# Patient Record
Sex: Male | Born: 1970 | Race: White | Hispanic: No | Marital: Married | Smoking: Never smoker
Health system: Southern US, Community
[De-identification: ages and names within clinical notes are randomized; demographics above are authoritative.]

## PROBLEM LIST (undated history)

## (undated) DIAGNOSIS — I471 Supraventricular tachycardia, unspecified: Secondary | ICD-10-CM

## (undated) DIAGNOSIS — M1990 Unspecified osteoarthritis, unspecified site: Secondary | ICD-10-CM

## (undated) HISTORY — DX: Supraventricular tachycardia: I47.1

## (undated) HISTORY — DX: Supraventricular tachycardia, unspecified: I47.10

---

## 2012-02-27 ENCOUNTER — Other Ambulatory Visit: Payer: Self-pay | Admitting: Specialist

## 2012-02-27 ENCOUNTER — Ambulatory Visit
Admission: RE | Admit: 2012-02-27 | Discharge: 2012-02-27 | Disposition: A | Discharge status: Home / Self Care | Payer: BC Managed Care – PPO | Source: Ambulatory Visit | Attending: Family Medicine | Admitting: Family Medicine

## 2012-02-27 ENCOUNTER — Other Ambulatory Visit: Payer: Self-pay | Admitting: Family Medicine

## 2012-02-27 DIAGNOSIS — M549 Dorsalgia, unspecified: Secondary | ICD-10-CM

## 2012-02-27 MED ORDER — IOHEXOL 180 MG/ML  SOLN
1.0000 mL | Freq: Once | INTRAMUSCULAR | Status: AC | PRN
  Administered 2012-02-27: 1 mL via EPIDURAL

## 2012-02-27 MED ORDER — METHYLPREDNISOLONE ACETATE 40 MG/ML INJ SUSP (RADIOLOG
120.0000 mg | Freq: Once | INTRAMUSCULAR | Status: AC
  Administered 2012-02-27: 120 mg via EPIDURAL

## 2012-02-27 NOTE — Discharge Instructions (Signed)

## 2016-11-20 DIAGNOSIS — R1013 Epigastric pain: Secondary | ICD-10-CM | POA: Insufficient documentation

## 2016-11-20 DIAGNOSIS — Z6830 Body mass index (BMI) 30.0-30.9, adult: Secondary | ICD-10-CM | POA: Insufficient documentation

## 2016-11-20 DIAGNOSIS — K828 Other specified diseases of gallbladder: Secondary | ICD-10-CM | POA: Insufficient documentation

## 2016-12-31 DIAGNOSIS — Z09 Encounter for follow-up examination after completed treatment for conditions other than malignant neoplasm: Secondary | ICD-10-CM | POA: Insufficient documentation

## 2017-10-25 ENCOUNTER — Encounter (HOSPITAL_BASED_OUTPATIENT_CLINIC_OR_DEPARTMENT_OTHER): Payer: Self-pay | Admitting: *Deleted

## 2017-10-25 ENCOUNTER — Emergency Department (HOSPITAL_BASED_OUTPATIENT_CLINIC_OR_DEPARTMENT_OTHER): Payer: BLUE CROSS/BLUE SHIELD

## 2017-10-25 ENCOUNTER — Other Ambulatory Visit: Payer: Self-pay

## 2017-10-25 ENCOUNTER — Emergency Department (HOSPITAL_BASED_OUTPATIENT_CLINIC_OR_DEPARTMENT_OTHER)
Admission: EM | Admit: 2017-10-25 | Discharge: 2017-10-25 | Disposition: A | Discharge status: Home / Self Care | Payer: BLUE CROSS/BLUE SHIELD | Attending: Emergency Medicine | Admitting: Emergency Medicine

## 2017-10-25 DIAGNOSIS — R079 Chest pain, unspecified: Secondary | ICD-10-CM | POA: Insufficient documentation

## 2017-10-25 DIAGNOSIS — R002 Palpitations: Secondary | ICD-10-CM | POA: Diagnosis not present

## 2017-10-25 HISTORY — DX: Unspecified osteoarthritis, unspecified site: M19.90

## 2017-10-25 LAB — BASIC METABOLIC PANEL
Anion gap: 10 (ref 5–15)
BUN: 15 mg/dL (ref 6–20)
CO2: 25 mmol/L (ref 22–32)
Calcium: 9.4 mg/dL (ref 8.9–10.3)
Chloride: 106 mmol/L (ref 101–111)
Creatinine, Ser: 0.96 mg/dL (ref 0.61–1.24)
GFR calc Af Amer: >60 mL/min (ref >60)
GFR calc non Af Amer: >60 mL/min (ref >60)
Glucose, Bld: 105 mg/dL — ABNORMAL HIGH (ref 65–99)
Potassium: 4 mmol/L (ref 3.5–5.1)
Sodium: 141 mmol/L (ref 135–145)

## 2017-10-25 LAB — CBC WITH DIFFERENTIAL/PLATELET
Basophils Absolute: 0 10*3/uL (ref 0.0–0.1)
Basophils Relative: 0 %
Eosinophils Absolute: 0.1 10*3/uL (ref 0.0–0.7)
Eosinophils Relative: 1 %
HCT: 44.8 % (ref 39.0–52.0)
Hemoglobin: 15.7 g/dL (ref 13.0–17.0)
Lymphocytes Relative: 23 %
Lymphs Abs: 1.6 10*3/uL (ref 0.7–4.0)
MCH: 31 pg (ref 26.0–34.0)
MCHC: 35 g/dL (ref 30.0–36.0)
MCV: 88.5 fL (ref 78.0–100.0)
Monocytes Absolute: 0.7 10*3/uL (ref 0.1–1.0)
Monocytes Relative: 10 %
Neutro Abs: 4.6 10*3/uL (ref 1.7–7.7)
Neutrophils Relative %: 66 %
Platelets: 191 10*3/uL (ref 150–400)
RBC: 5.06 MIL/uL (ref 4.22–5.81)
RDW: 12.6 % (ref 11.5–15.5)
WBC: 7 10*3/uL (ref 4.0–10.5)

## 2017-10-25 LAB — TROPONIN I
Troponin I: <0.03 ng/mL (ref <0.03)
Troponin I: <0.03 ng/mL (ref <0.03)

## 2017-10-25 NOTE — ED Triage Notes (Signed)
Pt reports 2 days of "tightness" to his center chest, worse today since stretching his arms this morning. Denies any sob, dizzyness, diaphoresis, nausea or any other c/o.

## 2017-10-25 NOTE — Discharge Instructions (Signed)
Please follow-up with a cardiologist for further evaluation and treatment of your symptoms.  Specify during a phone call which location would be more convenient for you.  Take your famotidine daily as prescribed to rule out your reflux a cause of your pain.  Please return to the emergency department immediately if you develop any new or worsening symptoms.

## 2017-10-25 NOTE — ED Provider Notes (Signed)
[facility] EMERGENCY DEPARTMENT Provider Note   CSN: [id number] Arrival date & time: 10/25/17  1012     History   Chief Complaint Chief Complaint  Patient presents with  . Chest Pain    HPI Thomas Perez is a 47 y.o. male with history of GERD who presents with a 1 day history of central chest pain.  Patient reports chest tightness with intermittent sharp pains.  Patient has had associated intermittent palpitations on the left side of his chest.  At one time, it happened when he rolled over in bed.  Resolves in about 30 seconds.  Patient reports he has had intermittent palpitations for the past 2-3 weeks, but the chest pain started last evening when he was getting ready for bed.  It got worse this morning when he stretched his arms when he woke up.  He reports lying in bed last night he had a throbbing sensation in his chest.  He denies any severe pain now, however he reports when he takes a very deep breath he feels mild tightness.  He denies paresthesias of his hands or fingers.  He denies any recent long trips, surgeries, known cancer, history of blood clots, new leg pain or swelling.  He denies any radiation of pain, fever, shortness of breath, abdominal pain, nausea, vomiting.  HPI  Past Medical History:  Diagnosis Date  . Arthritis     There are no active problems to display for this patient.   History reviewed. No pertinent surgical history.     Home Medications    Prior to Admission medications   Not on File    Family History History reviewed. No pertinent family history.  Social History Social History   Tobacco Use  . Smoking status: Never Smoker  . Smokeless tobacco: Never Used  Substance Use Topics  . Alcohol use: Not on file  . Drug use: Not on file     Allergies   Patient has no known allergies.   Review of Systems Review of Systems  Constitutional: Negative for chills and fever.  HENT: Negative for facial swelling and sore throat.     Respiratory: Negative for shortness of breath.   Cardiovascular: Positive for chest pain and palpitations. Negative for leg swelling.  Gastrointestinal: Negative for abdominal pain, nausea and vomiting.  Genitourinary: Negative for dysuria.  Musculoskeletal: Negative for back pain.  Skin: Negative for rash and wound.  Neurological: Negative for headaches.  Psychiatric/Behavioral: The patient is not nervous/anxious.      Physical Exam Updated Vital Signs BP 113/67   Pulse (!) 56   Temp 98.4 F (36.9 C) (Oral)   Resp 12   Ht 5\' 10"  (1.778 m)   Wt 96.6 kg (213 lb)   SpO2 100%   BMI 30.56 kg/m   Physical Exam  Constitutional: He appears well-developed and well-nourished. No distress.  HENT:  Head: Normocephalic and atraumatic.  Mouth/Throat: Oropharynx is clear and moist. No oropharyngeal exudate.  Eyes: Conjunctivae are normal. Pupils are equal, round, and reactive to light. Right eye exhibits no discharge. Left eye exhibits no discharge. No scleral icterus.  Neck: Normal range of motion. Neck supple. No thyromegaly present.  Cardiovascular: Regular rhythm, normal heart sounds and intact distal pulses. Exam reveals no gallop and no friction rub.  No murmur heard. Pulmonary/Chest: Effort normal and breath sounds normal. No stridor. No respiratory distress. He has no wheezes. He has no rales. He exhibits no tenderness.  Abdominal: Soft. Bowel sounds are normal. He  exhibits no distension. There is no tenderness. There is no rebound and no guarding.  Musculoskeletal: He exhibits no edema.  No calf edema or tenderness bilaterally  Lymphadenopathy:    He has no cervical adenopathy.  Neurological: He is alert. Coordination normal.  Skin: Skin is warm and dry. No rash noted. He is not diaphoretic. No pallor.  Psychiatric: He has a normal mood and affect.  Nursing note and vitals reviewed.    ED Treatments / Results  Labs (all labs ordered are listed, but only abnormal results  are displayed) Labs Reviewed  BASIC METABOLIC PANEL - Abnormal; Notable for the following components:      Result Value   Glucose, Bld 105 (*)    All other components within normal limits  CBC WITH DIFFERENTIAL/PLATELET  TROPONIN I  TROPONIN I    EKG  EKG Interpretation None       Radiology Dg Chest 2 View  Result Date: 10/25/2017 CLINICAL DATA:  Chest pain and palpitations EXAM: CHEST  2 VIEW COMPARISON:  None. FINDINGS: The heart size and mediastinal contours are within normal limits. Both lungs are clear. The visualized skeletal structures are unremarkable. IMPRESSION: No active cardiopulmonary disease. Electronically Signed   By: Franchot Gallo M.D.   On: 10/25/2017 12:59    Procedures Procedures (including critical care time)  Medications Ordered in ED Medications - No data to display   Initial Impression / Assessment and Plan / ED Course  I have reviewed the triage vital signs and the nursing notes.  Pertinent labs & imaging results that were available during my care of the patient were reviewed by me and considered in my medical decision making (see chart for details).     Patient with intermittent palpitations over the past 2-3 weeks.  He developed chest tightness last evening.  Wells and PERC negative.  Delta troponin is negative.  Low suspicion for ACS.  Palpitations may be causing anxiety type chest tightness, as patient does state he is stressed about the palpitations.  Although, no history of anxiety or panic attacks.  Will refer to cardiology for further workup, which may include Holter monitor.  I also advised to take patient's famotidine regularly to rule out GERD as a cause of patient's chest pain.  Return precautions discussed.  Patient understands and agrees with plan.  Patient vitals stable throughout ED course and discharged in satisfactory condition. I discussed patient case with Dr. Lita Mains who guided the patient's management and agrees with  plan.   Final Clinical Impressions(s) / ED Diagnoses   Final diagnoses:  Nonspecific chest pain  Palpitations    ED Discharge Orders    None       Frederica Kuster, PA-C 10/25/17 1649    Julianne Rice, MD 10/29/17 2233

## 2017-10-25 NOTE — ED Notes (Signed)
Patient transported to X-ray 

## 2017-10-30 ENCOUNTER — Ambulatory Visit (INDEPENDENT_AMBULATORY_CARE_PROVIDER_SITE_OTHER): Payer: BLUE CROSS/BLUE SHIELD | Admitting: Cardiovascular Disease

## 2017-10-30 ENCOUNTER — Ambulatory Visit (INDEPENDENT_AMBULATORY_CARE_PROVIDER_SITE_OTHER): Payer: BLUE CROSS/BLUE SHIELD

## 2017-10-30 ENCOUNTER — Encounter: Payer: Self-pay | Admitting: Cardiovascular Disease

## 2017-10-30 VITALS — BP 122/78 | HR 82 | Ht 70.0 in | Wt 219.0 lb

## 2017-10-30 DIAGNOSIS — I471 Supraventricular tachycardia: Secondary | ICD-10-CM | POA: Diagnosis not present

## 2017-10-30 DIAGNOSIS — Z13220 Encounter for screening for lipoid disorders: Secondary | ICD-10-CM

## 2017-10-30 NOTE — Progress Notes (Signed)
Cardiology Office Note:    Date:  10/30/2017   ID:  Thomas Perez, DOB February 19, 1971, MRN [id number]  PCP:  Enid Skeens., MD  Cardiologist:  No primary care provider on file.   Referring MD: Enid Skeens., MD   Chief Complaint  Patient presents with  . Palpitations    History of Present Illness:    Thomas Perez is a 47 y.o. male with a hx of gastroesophageal reflux disease who we are seen today for further evaluation of palpitations and chest tightness.  The patient is a relatively healthy 47 year old gentleman who was seen in the emergency room on February 8 for palpitations and chest tightness.  Last fall he had some palpitations - lasted less than 1 min.  Several days ago, he had a 2 minute episode .   Followed by a dull heaviness, pressure sensation  Not associated with exertion or eating , change of position Not associated with deep breath .  Has had ~ 10 episodes over the past several weeks.  These do not cause any syncope or presyncope  Thinks the HR is regular, but very fast  Occurs most days - not every day   Exercises on occasion.   Walks at work [employer], walks the plant floor on occasion )      Past Medical History:  Diagnosis Date  . Arthritis     History reviewed. No pertinent surgical history.  Current Medications: Current Meds  Medication Sig  . famotidine (PEPCID) 20 MG tablet Take 20 mg by mouth 2 (two) times daily.     Allergies:   Hydrocodone-acetaminophen   Social History   Socioeconomic History  . Marital status: Married    Spouse name: None  . Number of children: None  . Years of education: None  . Highest education level: None  Social Needs  . Financial resource strain: None  . Food insecurity - worry: None  . Food insecurity - inability: None  . Transportation needs - medical: None  . Transportation needs - non-medical: None  Occupational History  . None  Tobacco Use  . Smoking status: Never Smoker  . Smokeless  tobacco: Never Used  Substance and Sexual Activity  . Alcohol use: None  . Drug use: None  . Sexual activity: None  Other Topics Concern  . None  Social History Narrative  . None     Family History: The patient's family history includes Arrhythmia in his father and mother; Hypertension in his father.  ROS:   Please see the history of present illness.     All other systems reviewed and are negative.  EKGs/Labs/Other Studies Reviewed:    The following studies were reviewed today:   EKG:  EKG - Feb. 8, 2019 .  The ekg ordered today demonstrates  NSR at 81, no ST or T wave changes   Recent Labs: 10/25/2017: BUN 15; Creatinine, Ser 0.96; Hemoglobin 15.7; Platelets 191; Potassium 4.0; Sodium 141  Recent Lipid Panel No results found for: CHOL, TRIG, HDL, CHOLHDL, VLDL, LDLCALC, LDLDIRECT  Physical Exam:    VS:  BP 122/78   Pulse 82   Ht 5\' 10"  (1.778 m)   Wt 219 lb (99.3 kg)   SpO2 99%   BMI 31.42 kg/m     Wt Readings from Last 3 Encounters:  10/30/17 219 lb (99.3 kg)  10/25/17 213 lb (96.6 kg)     GEN:  Well nourished, well developed in no acute distress HEENT: Normal NECK: No  JVD; No carotid bruits LYMPHATICS: No lymphadenopathy CARDIAC:RR, no murmurs, rubs, gallops RESPIRATORY:  Clear to auscultation without rales, wheezing or rhonchi  ABDOMEN: Soft, non-tender, non-distended MUSCULOSKELETAL:  No edema; No deformity  SKIN: Warm and dry NEUROLOGIC:  Alert and oriented x 3 PSYCHIATRIC:  Normal affect   ASSESSMENT:    No diagnosis found. PLAN:    In order of problems listed above:  1. Palpitations:   Sounds like SVT.  Discussed Valsalva, diving reflex, carotid sinus massage Discussed the relative benign nature of these  Avoid caffiene, Check TSH Encouraged regular exercise   2. Screening for cholesterol elevation ( father had elevated cholesterol )    Medication Adjustments/Labs and Tests Ordered: Current medicines are reviewed at length with the  patient today.  Concerns regarding medicines are outlined above.  No orders of the defined types were placed in this encounter.  No orders of the defined types were placed in this encounter.   Signed, Mertie Moores, MD  10/30/2017 8:15 AM    [place]

## 2017-10-30 NOTE — Patient Instructions (Addendum)
Medication Instructions:  Your physician recommends that you continue on your current medications as directed. Please refer to the Current Medication list given to you today.   Labwork: TODAY - cholesterol, TSH   Testing/Procedures: Your physician has recommended that you wear an event monitor. Event monitors are medical devices that record the heart's electrical activity. Doctors most often [place] these monitors to diagnose arrhythmias. Arrhythmias are problems with the speed or rhythm of the heartbeat. The monitor is a small, portable device. You can wear one while you do your normal daily activities. This is usually used to diagnose what is causing palpitations/syncope (passing out).   Follow-Up: Your physician recommends that you return for a follow-up appointment on Friday March 15 at 7:40 am   If you need a refill on your cardiac medications before your next appointment, please call your pharmacy.   Thank you for choosing [facility]! Christen Bame, RN [phone]

## 2017-10-31 LAB — LIPID PANEL
CHOL/HDL RATIO: 5 ratio (ref 0.0–5.0)
Cholesterol, Total: 165 mg/dL (ref 100–199)
HDL: 33 mg/dL — AB (ref >39)
LDL Calculated: 102 mg/dL — ABNORMAL HIGH (ref 0–99)
TRIGLYCERIDES: 151 mg/dL — AB (ref 0–149)
VLDL Cholesterol Cal: 30 mg/dL (ref 5–40)

## 2017-10-31 LAB — TSH: TSH: 1.62 u[IU]/mL (ref 0.450–4.500)

## 2017-11-04 ENCOUNTER — Telehealth: Payer: Self-pay

## 2017-11-04 NOTE — Telephone Encounter (Signed)
Patient had monitor event of SVT on 2/15 (Friday) which Dr Acie Fredrickson addressed on OV 2/13.Marland Kitchen  No medication changes made at the time  Lpmtcb to check if symptomatic.

## 2017-11-08 ENCOUNTER — Telehealth: Payer: Self-pay

## 2017-11-08 MED ORDER — METOPROLOL TARTRATE 25 MG PO TABS: 25.0000 mg | ORAL_TABLET | Freq: Two times a day (BID) | ORAL | 11 refills | Status: DC

## 2017-11-08 NOTE — Telephone Encounter (Signed)
Received serious monitor repoirt from [employer] that on 11/07/2017 at 10:33PM CT that patient had a 49 second run of SVT at 159 BPM.  Called patient to see what he was doing and to see if he had any symptoms at that time.  Left message to call back.

## 2017-11-08 NOTE — Telephone Encounter (Signed)
The monitor report was taken by triage to Dr. Harrington Challenger, office DOD, who advised patient start Diltiazem 120 mg daily. I forwarded the call to Dr. Acie Fredrickson who advised he would like to start the patient on Lopressor 25 mg BID. He states an EP consult is not needed at this time but will continue to monitor patient.  Spoke with patient and reviewed Dr. Elmarie Shiley advice with him. Patient verbalized understanding and agreement with plan of care and will call back with additional questions or concerns. He thanked me for the call.

## 2017-11-08 NOTE — Telephone Encounter (Signed)
I see that Dr. Harrington Challenger has ordered Diltiazem 120 CD a day  I think he might do better with metoprolol 25 mg BID instead.    Dr. Harrington Challenger has ordered an EP consult.   It may be a bit early for that as we have not had time to see how he responds to medical therapy but I'm not opposed to that referral.

## 2017-11-08 NOTE — Telephone Encounter (Signed)
Spoke with patient regarding the call from our office about his monitor. He states he was laying in bed last night when the episode of SVT occurred. He states he felt like it lasted 40-45 seconds and states previous episode the week before seemed to last for the same amount of time. He states prior to getting the monitor he had an episode that lasted approximately 2 minutes. He states the monitor did not work for 2 days this week but he does not think he had any additional episodes of SVT during this time. I talked with him about the possible need for medication if he has frequent episodes of SVT. He states he is going on a cruise in 1 month and he would like to go ahead and start the medication if we feel that that is what is going to be needed. I advised that I will forward the message to Dr. Acie Fredrickson for advice and call him back later. He verbalized understanding and agreement and thanked me for the call.

## 2017-11-29 ENCOUNTER — Ambulatory Visit (INDEPENDENT_AMBULATORY_CARE_PROVIDER_SITE_OTHER): Payer: BLUE CROSS/BLUE SHIELD | Admitting: Cardiovascular Disease

## 2017-11-29 ENCOUNTER — Encounter: Payer: Self-pay | Admitting: Cardiovascular Disease

## 2017-11-29 VITALS — BP 102/60 | HR 55 | Ht 70.0 in | Wt 220.6 lb

## 2017-11-29 DIAGNOSIS — I471 Supraventricular tachycardia: Secondary | ICD-10-CM

## 2017-11-29 MED ORDER — PROPRANOLOL HCL 10 MG PO TABS: 10.0000 mg | ORAL_TABLET | Freq: Four times a day (QID) | ORAL | 3 refills | Status: DC | PRN

## 2017-11-29 NOTE — Progress Notes (Signed)
Cardiology Office Note:    Date:  11/29/2017   ID:  Thomas Perez, DOB July 01, 1971, MRN [id number]  PCP:  Enid Skeens., MD  Cardiologist:  Mertie Moores, MD   Referring MD: Enid Skeens., MD   Chief Complaint  Patient presents with  . Tachycardia    History of Present Illness:    Thomas Perez is a 47 y.o. male with a hx of gastroesophageal reflux disease who we are seen today for further evaluation of palpitations and chest tightness.  The patient is a relatively healthy 47 year old gentleman who was seen in the emergency room on February 8 for palpitations and chest tightness.  Last fall he had some palpitations - lasted less than 1 min.  Several days ago, he had a 2 minute episode .   Followed by a dull heaviness, pressure sensation  Not associated with exertion or eating , change of position Not associated with deep breath .  Has had ~ 10 episodes over the past several weeks.  These do not cause any syncope or presyncope  Thinks the HR is regular, but very fast  Occurs most days - not every day   Exercises on occasion.   Walks at work [employer], walks the plant floor on occasion )   November 29, 2017:    Hunter was seen in February with episodes of palpitations.  Clinically we thought it was consistent with SVT.  We started him on metoprolol and he seems to be doing a little bit better. Had some fatigue initially .    He continues to have some breakthrough SVT.  These episodes last for about 30 seconds to 1 minute. There is no associated dizziness.  He can just feel palpitations.   Past Medical History:  Diagnosis Date  . Arthritis     History reviewed. No pertinent surgical history.  Current Medications: Current Meds  Medication Sig  . famotidine (PEPCID) 20 MG tablet Take 20 mg by mouth daily.   . metoprolol tartrate (LOPRESSOR) 25 MG tablet Take 1 tablet (25 mg total) by mouth 2 (two) times daily.     Allergies:   Hydrocodone-acetaminophen   Social  History   Socioeconomic History  . Marital status: Married    Spouse name: None  . Number of children: None  . Years of education: None  . Highest education level: None  Social Needs  . Financial resource strain: None  . Food insecurity - worry: None  . Food insecurity - inability: None  . Transportation needs - medical: None  . Transportation needs - non-medical: None  Occupational History  . None  Tobacco Use  . Smoking status: Never Smoker  . Smokeless tobacco: Never Used  Substance and Sexual Activity  . Alcohol use: None  . Drug use: None  . Sexual activity: None  Other Topics Concern  . None  Social History Narrative  . None     Family History: The patient's family history includes Arrhythmia in his father and mother; Hypertension in his father.  ROS:   Please see the history of present illness.     All other systems reviewed and are negative.  EKGs/Labs/Other Studies Reviewed:    The following studies were reviewed today:  EKG:    Recent Labs: 10/25/2017: BUN 15; Creatinine, Ser 0.96; Hemoglobin 15.7; Platelets 191; Potassium 4.0; Sodium 141 10/30/2017: TSH 1.620  Recent Lipid Panel    Component Value Date/Time   CHOL 165 10/30/2017 0851   TRIG 151 (H)  10/30/2017 0851   HDL 33 (L) 10/30/2017 0851   CHOLHDL 5.0 10/30/2017 0851   LDLCALC 102 (H) 10/30/2017 0851    Physical Exam: Blood pressure 102/60, pulse (!) 55, height 5\' 10"  (1.778 m), weight 220 lb 9.6 oz (100.1 kg), SpO2 99 %.  GEN:  Well nourished, well developed in no acute distress HEENT: Normal NECK: No JVD; No carotid bruits LYMPHATICS: No lymphadenopathy CARDIAC: RR, no murmurs, rubs, gallops RESPIRATORY:  Clear to auscultation without rales, wheezing or rhonchi  ABDOMEN: Soft, non-tender, non-distended MUSCULOSKELETAL:  No edema; No deformity  SKIN: Warm and dry NEUROLOGIC:  Alert and oriented x 3   ASSESSMENT:    No diagnosis found. PLAN:    In order of problems listed  above:  1.  Supraventricular tachycardia: Agam had a documented episode of SVT on the event monitor.  We have started him on metoprolol 25 BID.  Initially he had some fatigue but seems to be tolerating it fairly well.  He thinks that the episodes of SVT are reduced but he still is having them. options including referral to electrophysiology for ablation.  I told him at this point it was an elective decision.  He seems to be doing fairly well with the metoprolol but continues to have some breakthrough SVT.  Warfarin 2 EP for discussion of ablation procedure.  We will schedule him for a six-month appointment.  If he decides to have an ablation then I will not necessarily need to see him in 6 months.    Medication Adjustments/Labs and Tests Ordered: Current medicines are reviewed at length with the patient today.  Concerns regarding medicines are outlined above.  No orders of the defined types were placed in this encounter.  Meds ordered this encounter  Medications  . propranolol (INDERAL) 10 MG tablet    Sig: Take 1 tablet (10 mg total) by mouth 4 (four) times daily as needed.    Dispense:  90 tablet    Refill:  3    Signed, Mertie Moores, MD  11/29/2017 8:17 AM    [place]

## 2017-11-29 NOTE — Patient Instructions (Addendum)
Medication Instructions:  Your physician has recommended you make the following change in your medication:    USE Propranolol (Inderal) 10 mg as needed up to 4 times per day for SVT and/or palpitations    Labwork: None Ordered   Testing/Procedures: None Ordered   Follow-Up: You have been referred to EP Cardiologist for discussion of SVT Ablation  Your physician wants you to follow-up in: 6 months with Dr. Acie Fredrickson. You will receive a reminder letter in the mail two months in advance. If you don't receive a letter, please call our office to schedule the follow-up appointment.   If you need a refill on your cardiac medications before your next appointment, please call your pharmacy.   Thank you for choosing [facility]! Christen Bame, RN [phone]

## 2017-12-03 ENCOUNTER — Telehealth: Payer: Self-pay | Admitting: Cardiovascular Disease

## 2017-12-03 NOTE — Telephone Encounter (Signed)
Notes recorded by Nahser, Wonda Cheng, MD on 12/03/2017 at 9:10 AM EDT Event monitor reveals SVT - also shows episodes of bradycardia Continue metoprolol for now He has an appt to be evaluated with EP for consideration for ablation   Called patient with results, per Dr. Acie Fredrickson. Patient verbalized understanding and will keep his appt with Dr. Lovena Le on 12/20/17.

## 2017-12-03 NOTE — Telephone Encounter (Signed)
New message    Please call patient with Cardiac event monitor results.

## 2017-12-10 ENCOUNTER — Encounter: Payer: Self-pay | Admitting: Internal Medicine

## 2017-12-20 ENCOUNTER — Ambulatory Visit (INDEPENDENT_AMBULATORY_CARE_PROVIDER_SITE_OTHER): Payer: BLUE CROSS/BLUE SHIELD | Admitting: Internal Medicine

## 2017-12-20 ENCOUNTER — Encounter: Payer: Self-pay | Admitting: Internal Medicine

## 2017-12-20 VITALS — BP 132/70 | HR 56 | Ht 70.0 in | Wt 220.0 lb

## 2017-12-20 DIAGNOSIS — I471 Supraventricular tachycardia: Secondary | ICD-10-CM | POA: Diagnosis not present

## 2017-12-20 DIAGNOSIS — R002 Palpitations: Secondary | ICD-10-CM

## 2017-12-20 NOTE — Progress Notes (Signed)
HPI Mr. Caylor is referred today by Dr. Acie Fredrickson for evaluation of SVT. He has had palpitations for the past few months which would start and stop suddenly. He wore a cardiac monitor which demonstrated episodes of SVT at 160/min. These would typically last seconds to about a minute or two. He has been started on metoprolol which made him feel a little fatigued initially. He has had a reduction in his episodes but still some break through episodes. The patient denies associated chest pain, sob, or syncope.  Allergies  Allergen Reactions  . Hydrocodone-Acetaminophen Other (See Comments) and Nausea And Vomiting    He has these symptoms when he doesn't eat before taking.     Current Outpatient Medications  Medication Sig Dispense Refill  . famotidine (PEPCID) 20 MG tablet Take 20 mg by mouth daily.     . metoprolol tartrate (LOPRESSOR) 25 MG tablet Take 1 tablet (25 mg total) by mouth 2 (two) times daily. 60 tablet 11  . propranolol (INDERAL) 10 MG tablet Take 1 tablet (10 mg total) by mouth 4 (four) times daily as needed. 90 tablet 3   No current facility-administered medications for this visit.      Past Medical History:  Diagnosis Date  . Arthritis   . SVT (supraventricular tachycardia) (HCC)     ROS:   All systems reviewed and negative except as noted in the HPI.   No past surgical history on file.   Family History  Problem Relation Age of Onset  . Arrhythmia Mother   . Hypertension Father   . Arrhythmia Father      Social History   Socioeconomic History  . Marital status: Married    Spouse name: Not on file  . Number of children: Not on file  . Years of education: Not on file  . Highest education level: Not on file  Occupational History  . Not on file  Social Needs  . Financial resource strain: Not on file  . Food insecurity:    Worry: Not on file    Inability: Not on file  . Transportation needs:    Medical: Not on file    Non-medical: Not on file    Tobacco Use  . Smoking status: Never Smoker  . Smokeless tobacco: Never Used  Substance and Sexual Activity  . Alcohol use: Not on file  . Drug use: Not on file  . Sexual activity: Not on file  Lifestyle  . Physical activity:    Days per week: Not on file    Minutes per session: Not on file  . Stress: Not on file  Relationships  . Social connections:    Talks on phone: Not on file    Gets together: Not on file    Attends religious service: Not on file    Active member of club or organization: Not on file    Attends meetings of clubs or organizations: Not on file    Relationship status: Not on file  . Intimate partner violence:    Fear of current or ex partner: Not on file    Emotionally abused: Not on file    Physically abused: Not on file    Forced sexual activity: Not on file  Other Topics Concern  . Not on file  Social History Narrative  . Not on file     BP 132/70   Pulse (!) 56   Ht 5\' 10"  (1.778 m)   Wt 220 lb (99.8 kg)  BMI 31.57 kg/m   Physical Exam:  Well appearing 47 yo man, NAD HEENT: Unremarkable Neck:  6 cm JVD, no thyromegally Lymphatics:  No adenopathy Back:  No CVA tenderness Lungs:  Clear with no wheezes HEART:  Regular rate rhythm, no murmurs, no rubs, no clicks Abd:  soft, positive bowel sounds, no organomegally, no rebound, no guarding Ext:  2 plus pulses, no edema, no cyanosis, no clubbing Skin:  No rashes no nodules Neuro:  CN II through XII intact, motor grossly intact  EKG - NSR with no pre-excitation  24 hour monitor - episodes of SVT at 160/min  Assess/Plan: 1. SVT - I have discussed the treatment options with the patient and the risks/benefits/goals/expectations of EP study and ablation were reviewed and he will call us if he wishes to proceed with ablation.   Mikle Bosworth.D.

## 2017-12-20 NOTE — Patient Instructions (Addendum)
Medication Instructions:  Your physician recommends that you continue on your current medications as directed. Please refer to the Current Medication list given to you today.  Labwork: None ordered.  Testing/Procedures: Your physician has recommended that you have an ablation. Catheter ablation is a medical procedure used to treat some cardiac arrhythmias (irregular heartbeats). During catheter ablation, a long, thin, flexible tube is put into a blood vessel in your groin (upper thigh), or neck. This tube is called an ablation catheter. It is then guided to your heart through the blood vessel. Radio frequency waves destroy small areas of heart tissue where abnormal heartbeats may cause an arrhythmia to start. Please see the instruction sheet given to you today.  Follow-Up:  If you decide you want to go ahead with this procedure give me a call:  Thomas Perez [phone]  Any Other Special Instructions Will Be Listed Below (If Applicable).  If you need a refill on your cardiac medications before your next appointment, please call your pharmacy.   Cardiac Ablation Cardiac ablation is a procedure to disable (ablate) a small amount of heart tissue in very specific places. The heart has many electrical connections. Sometimes these connections are abnormal and can cause the heart to beat very fast or irregularly. Ablating some of the problem areas can improve the heart rhythm or return it to normal. Ablation may be done for people who:  Have Wolff-Parkinson-White syndrome.  Have fast heart rhythms (tachycardia).  Have taken medicines for an abnormal heart rhythm (arrhythmia) that were not effective or caused side effects.  Have a high-risk heartbeat that may be life-threatening.  During the procedure, a small incision is made in the neck or the groin, and a long, thin, flexible tube (catheter) is inserted into the incision and moved to the heart. Small devices (electrodes) on the tip of the  catheter will send out electrical currents. A type of X-ray (fluoroscopy) will be used to help guide the catheter and to provide images of the heart. Tell a health care provider about:  Any allergies you have.  All medicines you are taking, including vitamins, herbs, eye drops, creams, and over-the-counter medicines.  Any problems you or family members have had with anesthetic medicines.  Any blood disorders you have.  Any surgeries you have had.  Any medical conditions you have, such as kidney failure.  Whether you are pregnant or may be pregnant. What are the risks? Generally, this is a safe procedure. However, problems may occur, including:  Infection.  Bruising and bleeding at the catheter insertion site.  Bleeding into the chest, especially into the sac that surrounds the heart. This is a serious complication.  Stroke or blood clots.  Damage to other structures or organs.  Allergic reaction to medicines or dyes.  Need for a permanent pacemaker if the normal electrical system is damaged. A pacemaker is a small computer that sends electrical signals to the heart and helps your heart beat normally.  The procedure not being fully effective. This may not be recognized until months later. Repeat ablation procedures are sometimes required.  What happens before the procedure?  Follow instructions from your health care provider about eating or drinking restrictions.  Ask your health care provider about: ? Changing or stopping your regular medicines. This is especially important if you are taking diabetes medicines or blood thinners. ? Taking medicines such as aspirin and ibuprofen. These medicines can thin your blood. Do not take these medicines before your procedure if your health care  provider instructs you not to.  Plan to have someone take you home from the hospital or clinic.  If you will be going home right after the procedure, plan to have someone with you for 24  hours. What happens during the procedure?  To lower your risk of infection: ? Your health care team will wash or sanitize their hands. ? Your skin will be washed with soap. ? Hair may be removed from the incision area.  An IV tube will be inserted into one of your veins.  You will be given a medicine to help you relax (sedative).  The skin on your neck or groin will be numbed.  An incision will be made in your neck or your groin.  A needle will be inserted through the incision and into a large vein in your neck or groin.  A catheter will be inserted into the needle and moved to your heart.  Dye may be injected through the catheter to help your surgeon see the area of the heart that needs treatment.  Electrical currents will be sent from the catheter to ablate heart tissue in desired areas. There are three types of energy that may be used to ablate heart tissue: ? Heat (radiofrequency energy). ? Laser energy. ? Extreme cold (cryoablation).  When the necessary tissue has been ablated, the catheter will be removed.  Pressure will be held on the catheter insertion area to prevent excessive bleeding.  A bandage (dressing) will be placed over the catheter insertion area. The procedure may vary among health care providers and hospitals. What happens after the procedure?  Your blood pressure, heart rate, breathing rate, and blood oxygen level will be monitored until the medicines you were given have worn off.  Your catheter insertion area will be monitored for bleeding. You will need to lie still for a few hours to ensure that you do not bleed from the catheter insertion area.  Do not drive for 24 hours or as long as directed by your health care provider. Summary  Cardiac ablation is a procedure to disable (ablate) a small amount of heart tissue in very specific places. Ablating some of the problem areas can improve the heart rhythm or return it to normal.  During the procedure,  electrical currents will be sent from the catheter to ablate heart tissue in desired areas. This information is not intended to replace advice given to you by your health care provider. Make sure you discuss any questions you have with your health care provider. Document Released: 01/20/2009 Document Revised: 07/23/2016 Document Reviewed: 07/23/2016 Elsevier Interactive Patient Education  [employer].

## 2018-08-18 IMAGING — CR DG CHEST 2V
2 series · 2 of 2 positions shown · non-contrast
Comparison: None.

CLINICAL DATA: Chest pain and palpitations

EXAM:
CHEST  2 VIEW

[w chest pa]
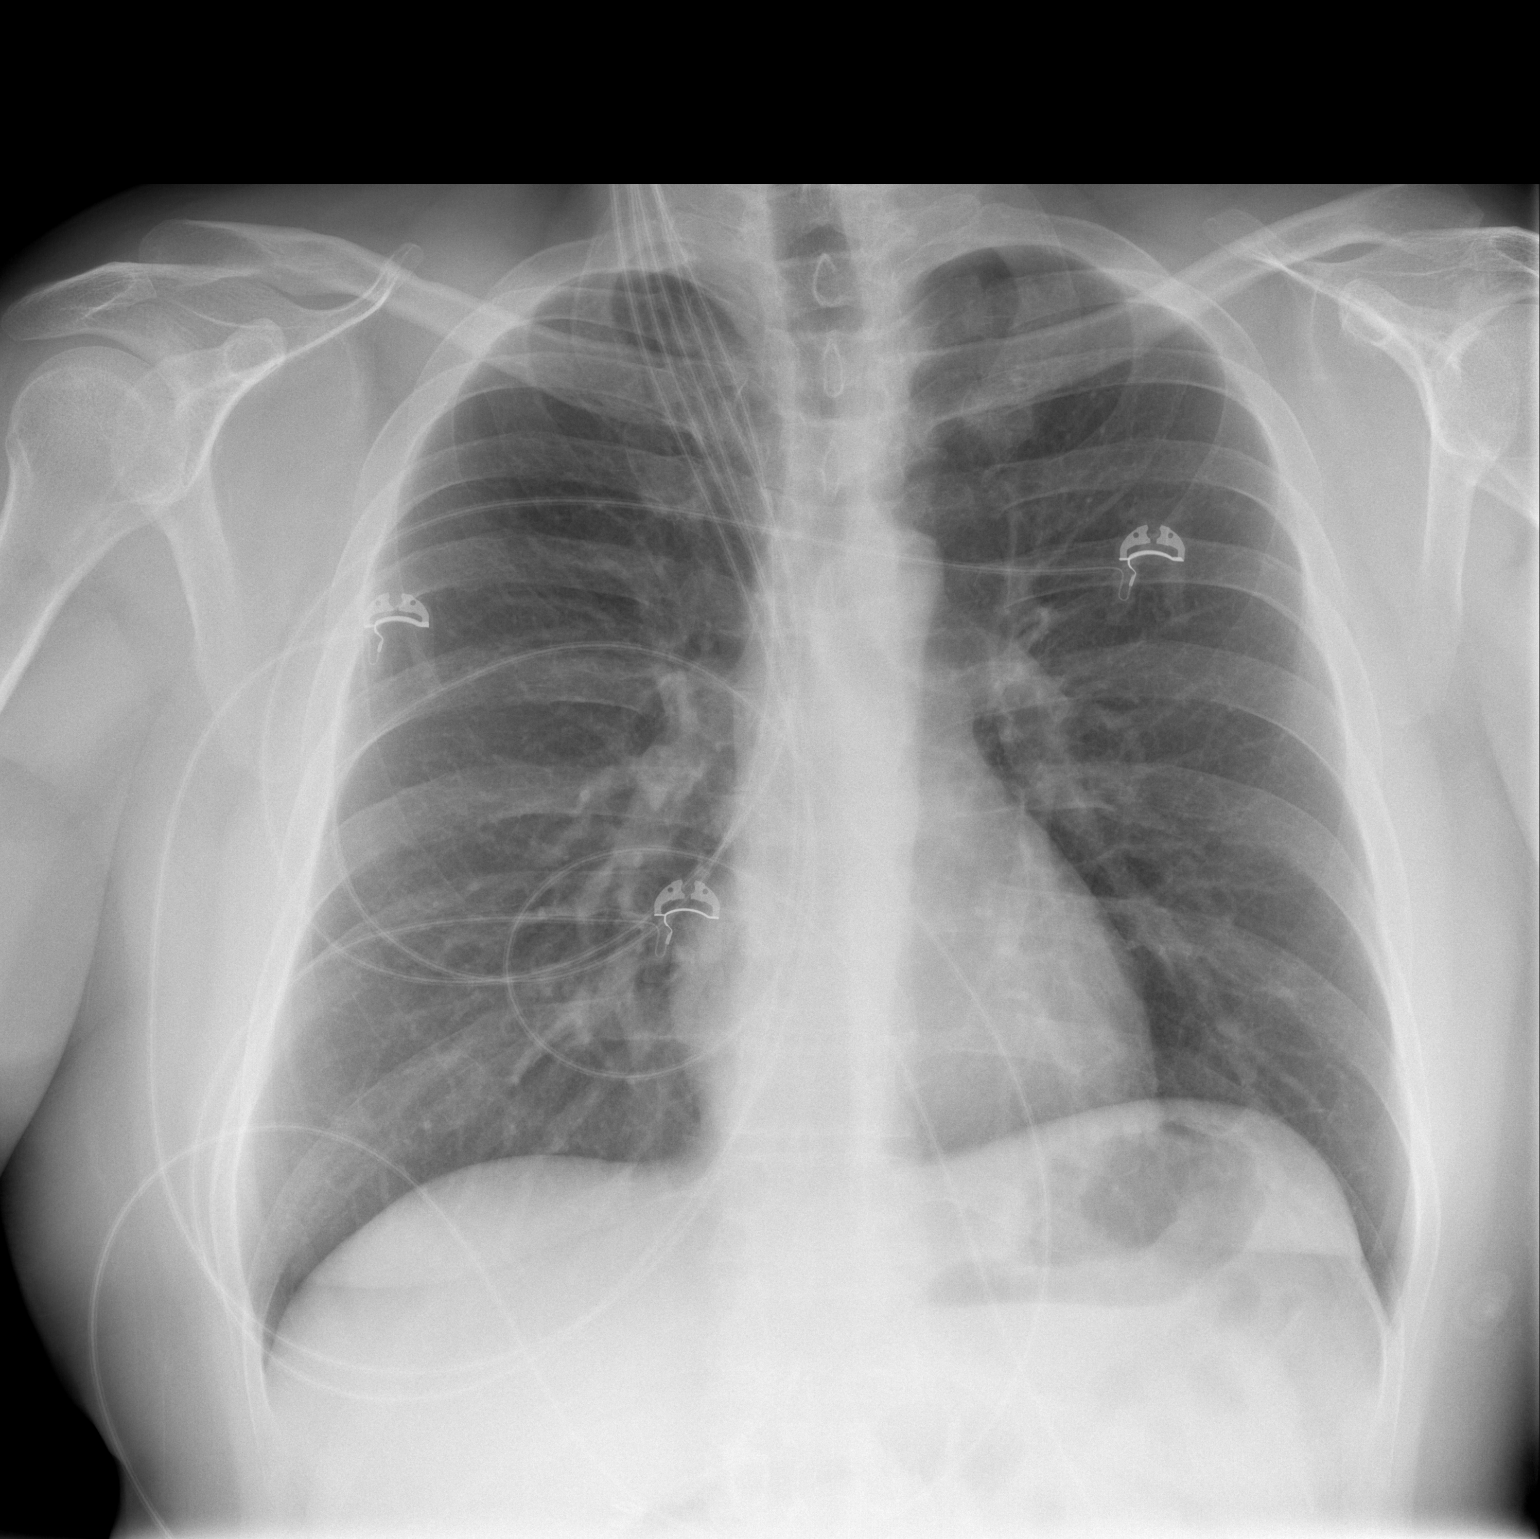

[w chest lat]
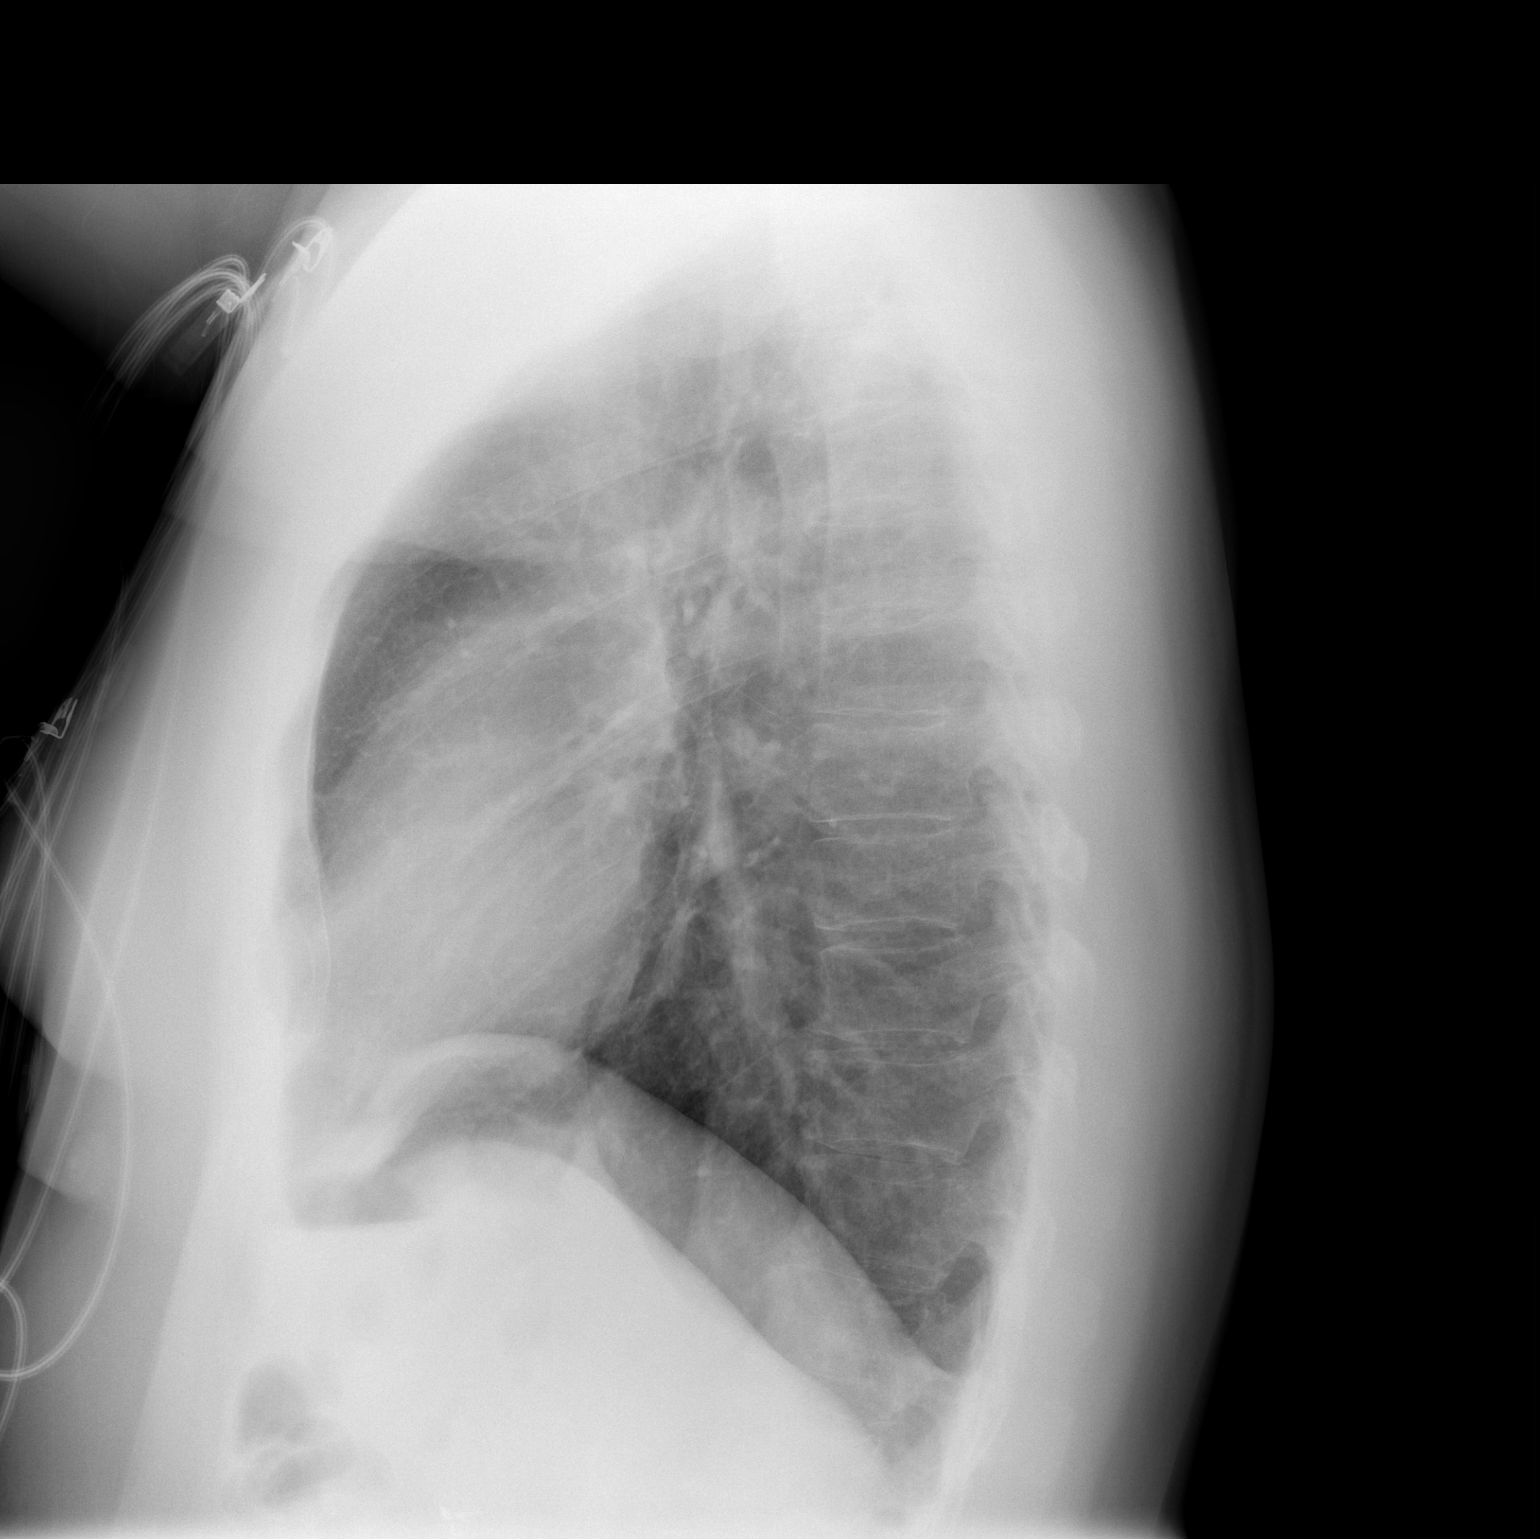

[2 of 2 positions shown; findings below may reference images not displayed]

FINDINGS: The heart size and mediastinal contours are within normal limits.
Both lungs are clear. The visualized skeletal structures are
unremarkable.
IMPRESSION: No active cardiopulmonary disease.

## 2018-11-14 ENCOUNTER — Other Ambulatory Visit: Payer: Self-pay | Admitting: Cardiovascular Disease

## 2018-12-09 ENCOUNTER — Other Ambulatory Visit: Payer: Self-pay | Admitting: Cardiovascular Disease

## 2019-03-02 ENCOUNTER — Other Ambulatory Visit: Payer: Self-pay | Admitting: Cardiovascular Disease

## 2019-04-27 ENCOUNTER — Telehealth: Payer: Self-pay

## 2019-04-27 NOTE — Telephone Encounter (Signed)
Called patient in regards to refill request for metoprolol that was received. Patient has not been seen in greater than 1 year. Left message for patient to call back to inform us whether or not he continues to take metoprolol and to schedule a follow-up visit.

## 2019-04-29 MED ORDER — METOPROLOL TARTRATE 25 MG PO TABS: 25.0000 mg | ORAL_TABLET | Freq: Two times a day (BID) | ORAL | 0 refills | Status: DC

## 2019-04-29 NOTE — Telephone Encounter (Signed)
Spoke with patient who states he does not want to have an office visit due to the high co-pay. He states he elected not to have SVT ablation due to the cost and that taking metoprolol is very cheap and he manages well. I advised that he needs to be seen yearly in order for [place] to continue to fill his prescriptions. I scheduled him for virtual visit on 8/20 and advised him to call back with questions or concerns. He verbalized understanding and agreement and thanked me for the call.

## 2019-05-06 ENCOUNTER — Telehealth: Payer: Self-pay

## 2019-05-06 NOTE — Telephone Encounter (Signed)

## 2019-05-06 NOTE — Telephone Encounter (Signed)
Called and spoke with patient and verified virtual appointment tomorrow date and time. Reviewed medications/allergies/pharmacy and got verbal consent.

## 2019-05-06 NOTE — Progress Notes (Signed)
Virtual Visit via Telephone Note   This visit type was conducted due to national recommendations for restrictions regarding the COVID-19 Pandemic (e.g. social distancing) in an effort to limit this patient's exposure and mitigate transmission in our community.  Due to his co-morbid illnesses, this patient is at least at moderate risk for complications without adequate follow up.  This format is felt to be most appropriate for this patient at this time.  The patient did not have access to video technology/had technical difficulties with video requiring transitioning to audio format only (telephone).  All issues noted in this document were discussed and addressed.  No physical exam could be performed with this format.  Please refer to the patient's chart for his  consent to telehealth for [facility].   Date:  05/07/2019   ID:  Thomas Perez, DOB August 17, 1971, MRN [id number]  Patient Location: Home Provider Location: Home  PCP:  Thomas Perez., MD  Cardiologist:  Thomas Moores, MD  Electrophysiologist:  None   Evaluation Performed:  Follow-Up Visit  Chief Complaint:  SVT   History of Present Illness:    Thomas Perez is a 48 y.o. male with  Hx of SVT. He has seen EP Thomas Perez) who offered SVT ablation if [name] wants to pursue it  Still has some SVT once a month or so . Will last for 30 seconds up to 2 minute. Does the valsalva / deep breathing and can get it to resolve.no cp or dyspnea.  On metooprolol Has not had to take the inderal     The patient does not have symptoms concerning for COVID-19 infection (fever, chills, cough, or new shortness of breath).    Past Medical History:  Diagnosis Date  . Arthritis   . SVT (supraventricular tachycardia) (HCC)    No past surgical history on file.   Current Meds  Medication Sig  . famotidine (PEPCID) 20 MG tablet Take 20 mg by mouth daily.   . metoprolol tartrate (LOPRESSOR) 25 MG tablet Take 1 tablet (25 mg total) by mouth 2  (two) times daily. Please schedule appt for future refills. 2nd attempt.  . propranolol (INDERAL) 10 MG tablet Take 1 tablet (10 mg total) by mouth 4 (four) times daily as needed.      Allergies:   Hydrocodone-acetaminophen   Social History   Tobacco Use  . Smoking status: Never Smoker  . Smokeless tobacco: Never Used  Substance Use Topics  . Alcohol use: Not on file  . Drug use: Not on file     Family Hx: The patient's family history includes Arrhythmia in his father and mother; Hypertension in his father.  ROS:   Please see the history of present illness.     All other systems reviewed and are negative.   Prior CV studies:   The following studies were reviewed today:    Labs/Other Tests and Data Reviewed:    EKG:  No ECG reviewed.  Recent Labs: No results found for requested labs within last 8760 hours.   Recent Lipid Panel Lab Results  Component Value Date/Time   CHOL 165 10/30/2017 08:51 AM   TRIG 151 (H) 10/30/2017 08:51 AM   HDL 33 (L) 10/30/2017 08:51 AM   CHOLHDL 5.0 10/30/2017 08:51 AM   LDLCALC 102 (H) 10/30/2017 08:51 AM    Wt Readings from Last 3 Encounters:  05/07/19 220 lb (99.8 kg)  12/20/17 220 lb (99.8 kg)  11/29/17 220 lb 9.6 oz (100.1 kg)  Objective:    Vital Signs:  Pulse (!) 58   Temp 98.6 F (37 C)   Ht 5\' 10"  (1.778 m)   Wt 220 lb (99.8 kg)   BMI 31.57 kg/m    No exam due to telephone visit   ASSESSMENT & PLAN:    1. Supraventricular tachycardia: Thomas Perez is seen today for follow-up of his SVT.  He continues to have episodes of SVT but overall these last for only a brief time.  He says they might resolve and as little as 30 seconds but also might last 2 or 3 minutes.  He does a routine of  deep breathing which is a variant of the Valsalva maneuver that we taught him.  He is not had to take any extra metoprolol or propranolol.  We discussed the possibility of doing an SVT ablation.  He does not want to do right now because of  cost.  He will continue with medical therapy for now and will call us or Dr. Lovena Perez if he decides to have the ablation.  I will plan on seeing him again in 1 year.  COVID-19 Education: The signs and symptoms of COVID-19 were discussed with the patient and how to seek care for testing (follow up with PCP or arrange E-visit).  The importance of social distancing was discussed today.  Time:   Today, I have spent  10 minutes with the patient with telehealth technology discussing the above problems.     Medication Adjustments/Labs and Tests Ordered: Current medicines are reviewed at length with the patient today.  Concerns regarding medicines are outlined above.   Tests Ordered: No orders of the defined types were placed in this encounter.   Medication Changes: No orders of the defined types were placed in this encounter.   Follow Up:  In Person in 1 year(s)  Signed, Thomas Moores, MD  05/07/2019 8:47 AM    [place]

## 2019-05-06 NOTE — Telephone Encounter (Signed)
Called and no answer to go over medications and confirm virtual visit date and time.

## 2019-05-07 ENCOUNTER — Other Ambulatory Visit: Payer: Self-pay

## 2019-05-07 ENCOUNTER — Telehealth (INDEPENDENT_AMBULATORY_CARE_PROVIDER_SITE_OTHER): Payer: BC Managed Care – PPO | Admitting: Cardiovascular Disease

## 2019-05-07 VITALS — BP 122/80 | HR 58 | Temp 98.6°F | Ht 70.0 in | Wt 220.0 lb

## 2019-05-07 DIAGNOSIS — I471 Supraventricular tachycardia, unspecified: Secondary | ICD-10-CM

## 2019-05-07 DIAGNOSIS — Z7189 Other specified counseling: Secondary | ICD-10-CM

## 2019-05-07 NOTE — Patient Instructions (Signed)

## 2019-07-04 ENCOUNTER — Other Ambulatory Visit: Payer: Self-pay | Admitting: Cardiovascular Disease

## 2020-01-05 DIAGNOSIS — R52 Pain, unspecified: Secondary | ICD-10-CM | POA: Diagnosis not present

## 2020-01-05 DIAGNOSIS — M6788 Other specified disorders of synovium and tendon, other site: Secondary | ICD-10-CM | POA: Diagnosis not present

## 2020-02-02 DIAGNOSIS — M6788 Other specified disorders of synovium and tendon, other site: Secondary | ICD-10-CM | POA: Diagnosis not present

## 2020-03-15 DIAGNOSIS — M6788 Other specified disorders of synovium and tendon, other site: Secondary | ICD-10-CM | POA: Diagnosis not present

## 2020-06-01 DIAGNOSIS — M6788 Other specified disorders of synovium and tendon, other site: Secondary | ICD-10-CM | POA: Diagnosis not present

## 2020-07-22 DIAGNOSIS — Z20822 Contact with and (suspected) exposure to covid-19: Secondary | ICD-10-CM | POA: Diagnosis not present

## 2020-11-26 DIAGNOSIS — R1084 Generalized abdominal pain: Secondary | ICD-10-CM | POA: Diagnosis not present

## 2020-11-27 DIAGNOSIS — R109 Unspecified abdominal pain: Secondary | ICD-10-CM | POA: Diagnosis not present

## 2020-11-27 DIAGNOSIS — R197 Diarrhea, unspecified: Secondary | ICD-10-CM | POA: Diagnosis not present

## 2020-12-02 DIAGNOSIS — Z20828 Contact with and (suspected) exposure to other viral communicable diseases: Secondary | ICD-10-CM | POA: Diagnosis not present

## 2020-12-02 DIAGNOSIS — U071 COVID-19: Secondary | ICD-10-CM | POA: Diagnosis not present

## 2021-01-11 DIAGNOSIS — M7712 Lateral epicondylitis, left elbow: Secondary | ICD-10-CM | POA: Diagnosis not present

## 2021-01-11 DIAGNOSIS — R52 Pain, unspecified: Secondary | ICD-10-CM | POA: Diagnosis not present

## 2021-01-25 ENCOUNTER — Encounter: Payer: Self-pay | Admitting: Physician Assistant

## 2021-02-17 ENCOUNTER — Encounter: Payer: Self-pay | Admitting: Physician Assistant

## 2021-02-17 ENCOUNTER — Ambulatory Visit (INDEPENDENT_AMBULATORY_CARE_PROVIDER_SITE_OTHER): Payer: BC Managed Care – PPO | Admitting: Physician Assistant

## 2021-02-17 ENCOUNTER — Other Ambulatory Visit (INDEPENDENT_AMBULATORY_CARE_PROVIDER_SITE_OTHER): Payer: BC Managed Care – PPO

## 2021-02-17 VITALS — BP 128/84 | HR 68 | Ht 70.0 in | Wt 228.0 lb

## 2021-02-17 DIAGNOSIS — R109 Unspecified abdominal pain: Secondary | ICD-10-CM

## 2021-02-17 DIAGNOSIS — R197 Diarrhea, unspecified: Secondary | ICD-10-CM

## 2021-02-17 LAB — CBC WITH DIFFERENTIAL/PLATELET
Basophils Absolute: 0.1 10*3/uL (ref 0.0–0.1)
Basophils Relative: 1.2 % (ref 0.0–3.0)
Eosinophils Absolute: 0.2 10*3/uL (ref 0.0–0.7)
Eosinophils Relative: 2.4 % (ref 0.0–5.0)
HCT: 45.8 % (ref 39.0–52.0)
Hemoglobin: 15.6 g/dL (ref 13.0–17.0)
Lymphocytes Relative: 28.7 % (ref 12.0–46.0)
Lymphs Abs: 1.8 10*3/uL (ref 0.7–4.0)
MCHC: 34.1 g/dL (ref 30.0–36.0)
MCV: 90.9 fl (ref 78.0–100.0)
Monocytes Absolute: 0.6 10*3/uL (ref 0.1–1.0)
Monocytes Relative: 9.7 % (ref 3.0–12.0)
Neutro Abs: 3.7 10*3/uL (ref 1.4–7.7)
Neutrophils Relative %: 58 % (ref 43.0–77.0)
Platelets: 190 10*3/uL (ref 150.0–400.0)
RBC: 5.04 Mil/uL (ref 4.22–5.81)
RDW: 13.4 % (ref 11.5–15.5)
WBC: 6.4 10*3/uL (ref 4.0–10.5)

## 2021-02-17 LAB — SEDIMENTATION RATE: Sed Rate: 7 mm/hr (ref 0–20)

## 2021-02-17 MED ORDER — SUTAB 1479-225-188 MG PO TABS: 1.0000 | ORAL_TABLET | ORAL | 0 refills | Status: DC

## 2021-02-17 MED ORDER — DICYCLOMINE HCL 10 MG PO CAPS: 10.0000 mg | ORAL_CAPSULE | Freq: Two times a day (BID) | ORAL | 1 refills | Status: DC | PRN

## 2021-02-17 NOTE — Patient Instructions (Addendum)
If you are age 52 or younger, your body mass index should be between 19-25. Your Body mass index is 32.71 kg/m. If this is out of the aformentioned range listed, please consider follow up with your Primary Care Provider.  __________________________________________________________  The [place] [facility] providers would like to encourage you to use [facility] to communicate with providers for non-urgent requests or questions.  Due to long hold times on the telephone, sending your provider a message by [facility] may be a faster and more efficient way to get a response.  Please allow 48 business hours for a response.  Please remember that this is for non-urgent requests.   You have been scheduled for a colonoscopy. Please follow written instructions given to you at your visit today.  Please pick up your prep supplies at the pharmacy within the next 1-3 days. If you use inhalers (even only as needed), please bring them with you on the day of your procedure.  Your provider has requested that you go to the basement level for lab work before leaving today. Press "B" on the elevator. The lab is located at the first door on the left as you exit the elevator.  Start Bentyl 10 mg 1 tablet twice daily as needed for abdominal cramping.  Follow up pending.  Thank you for entrusting me with your care and choosing [facility].  Amy Esterwood, PA-C

## 2021-02-17 NOTE — Progress Notes (Signed)
Subjective:    Patient ID: Thomas Perez, male    DOB: 12/01/1970, 50 y.o.   MRN: [id number]  HPI Thomas Perez is a pleasant 50 year old male, new to GI today referred by [facility] urgent care/Dr. Deberah Castle.  Patient has not had any prior GI evaluation. He says he had onset of current symptoms about 4 months ago and that symptoms have been persistent.  He says his illness started with loose stools which then turned to diarrhea.  Gradually he developed symptoms of bloating and abdominal pressure especially with p.o. intake.  He has a sense of an upset stomach which is generally worse in the morning with a lot of gurgling and some mild discomfort.  He says usually has at least 3-4 episodes of diarrhea each morning, then may be okay for the rest of the day, occasionally has additional episode of diarrhea.  He has not seen any melena or hematochezia.  Stool is not particularly malodorous.  He says he is eating less than he normally does because if he eats a normal amount about an hour later he will have some increase in abdominal discomfort and says he just does not feel well.  He says he feels feverish or warm at times after meals.  He has been eating very bland with no real change in symptoms.  No nausea or vomiting, no documented fevers.  He has had some occasional heartburn.  Weight overall has been stable. He had not been on any recent antibiotics, no changes in medications diet or supplements.  No family members ill.  No recent travel. He is status post cholecystectomy about 5 years ago, and has history of SVT. Family history is negative for GI disease as far as he is aware. He did have stool for O&P and a stool culture done through primary care March 2022 both of which were negative TTG and IgA normal, and be met was unremarkable.  He has been using Pepto-Bismol as needed.  Review of Systems Pertinent positive and negative review of systems were noted in the above HPI section.  All other review of systems  was otherwise negative.  Outpatient Encounter Medications as of 02/17/2021  Medication Sig  . dicyclomine (BENTYL) 10 MG capsule Take 1 capsule (10 mg total) by mouth 2 (two) times daily as needed (Abdominal Crampind).  . famotidine (PEPCID) 10 MG tablet Take 10 mg by mouth daily.  . Sodium Sulfate-Mag Sulfate-KCl (SUTAB) [phone] MG TABS Take 1 kit by mouth as directed.  . [DISCONTINUED] famotidine (PEPCID) 20 MG tablet Take 20 mg by mouth daily.   . [DISCONTINUED] metoprolol tartrate (LOPRESSOR) 25 MG tablet TAKE 1 TABLET BY MOUTH TWICE A DAY  . [DISCONTINUED] propranolol (INDERAL) 10 MG tablet Take 1 tablet (10 mg total) by mouth 4 (four) times daily as needed.   No facility-administered encounter medications on file as of 02/17/2021.   Allergies  Allergen Reactions  . Hydrocodone-Acetaminophen Other (See Comments) and Nausea And Vomiting    He has these symptoms when he doesn't eat before taking.   Patient Active Problem List   Diagnosis Date Noted  . SVT (supraventricular tachycardia) ([place]) 05/07/2019  . Educated about COVID-19 virus infection 05/07/2019  . Postoperative examination 12/31/2016  . Biliary dyskinesia 11/20/2016  . BMI 30.0-30.9,adult 11/20/2016  . Epigastric pain 11/20/2016   Social History   Socioeconomic History  . Marital status: Married    Spouse name: Not on file  . Number of children: Not on file  . Years of  education: Not on file  . Highest education level: Not on file  Occupational History  . Not on file  Tobacco Use  . Smoking status: Never Smoker  . Smokeless tobacco: Never Used  Vaping Use  . Vaping Use: Never used  Substance and Sexual Activity  . Alcohol use: Never  . Drug use: Never  . Sexual activity: Not on file  Other Topics Concern  . Not on file  Social History Narrative  . Not on file   Social Determinants of Health   Financial Resource Strain: Not on file  Food Insecurity: Not on file  Transportation Needs: Not on file   Physical Activity: Not on file  Stress: Not on file  Social Connections: Not on file  Intimate Partner Violence: Not on file    Thomas Perez family history includes Arrhythmia in his father and mother; Esophageal cancer in his maternal grandfather; Hypertension in his father; Prostate cancer in his paternal grandfather.      Objective:    Vitals:   02/17/21 1427  BP: 128/84  Pulse: 68    Physical Exam Well-developed well-nourished white male in no acute distress.  Height, Weight, 228 BMI 32.7  HEENT; nontraumatic normocephalic, EOMI, PE R LA, sclera anicteric. Oropharynx; not examined today Neck; supple, no JVD Cardiovascular; regular rate and rhythm with S1-S2, no murmur rub or gallop Pulmonary; Clear bilaterally Abdomen; soft, no focal tenderness nondistended, no palpable mass or hepatosplenomegaly, bowel sounds are active Rectal; not done today Skin; benign exam, no jaundice rash or appreciable lesions Extremities; no clubbing cyanosis or edema skin warm and dry Neuro/Psych; alert and oriented x4, grossly nonfocal mood and affect appropriate       Assessment & Plan:   #81 50 year old white male with 4 to 45-monthhistory of persistent diarrhea usually having at least 4-5 bowel movements per day, generalized mild abdominal discomfort, bloating and pressure postprandially. Initial evaluation per PCP with stool culture and O&P negative, C. difficile toxin negative, and TTG and IgA negative.  Etiology of symptoms is not clear, doubt infectious due to persistence of symptoms over multiple months, rule out new onset IBD, rule out occult neoplasm  #2 status post cholecystectomy approximately 5 years ago 3.  History of SVT  Plan; CBC with differential, sed rate, CRP, GI path panel, stool for lactoferrin Start Bentyl 10 mg p.o. twice daily as needed Continue bland diet We will go ahead and schedule for colonoscopy with Dr. PHenrene Pastor  Procedure was discussed in detail with the  patient including indications risks and benefits and he is agreeable to proceed. Further recommendations pending findings of above.  Jash Wahlen S Gabrien Mentink PA-C 02/17/2021   Cc: SEnid Skeens, MD

## 2021-02-18 NOTE — Progress Notes (Signed)
Assessment and plan reviewed 

## 2021-02-20 LAB — C-REACTIVE PROTEIN: CRP: <1 mg/dL (ref 0.5–20.0)

## 2021-02-21 ENCOUNTER — Other Ambulatory Visit: Payer: BC Managed Care – PPO

## 2021-02-21 DIAGNOSIS — R109 Unspecified abdominal pain: Secondary | ICD-10-CM

## 2021-02-21 DIAGNOSIS — R197 Diarrhea, unspecified: Secondary | ICD-10-CM | POA: Diagnosis not present

## 2021-02-22 LAB — FECAL LACTOFERRIN, QUANT
Fecal Lactoferrin: NEGATIVE
MICRO NUMBER:: 11978392
SPECIMEN QUALITY:: ADEQUATE

## 2021-02-25 LAB — GI PROFILE, STOOL, PCR

## 2021-03-10 ENCOUNTER — Ambulatory Visit (AMBULATORY_SURGERY_CENTER): Payer: BC Managed Care – PPO | Admitting: Internal Medicine

## 2021-03-10 ENCOUNTER — Encounter: Payer: Self-pay | Admitting: Internal Medicine

## 2021-03-10 ENCOUNTER — Other Ambulatory Visit: Payer: Self-pay

## 2021-03-10 VITALS — BP 101/62 | HR 55 | Temp 97.8°F | Resp 11 | Ht 70.0 in | Wt 228.0 lb

## 2021-03-10 DIAGNOSIS — K5289 Other specified noninfective gastroenteritis and colitis: Secondary | ICD-10-CM | POA: Diagnosis not present

## 2021-03-10 DIAGNOSIS — K529 Noninfective gastroenteritis and colitis, unspecified: Secondary | ICD-10-CM | POA: Diagnosis not present

## 2021-03-10 DIAGNOSIS — R197 Diarrhea, unspecified: Secondary | ICD-10-CM

## 2021-03-10 DIAGNOSIS — Z1211 Encounter for screening for malignant neoplasm of colon: Secondary | ICD-10-CM

## 2021-03-10 DIAGNOSIS — D125 Benign neoplasm of sigmoid colon: Secondary | ICD-10-CM

## 2021-03-10 MED ORDER — SODIUM CHLORIDE 0.9 % IV SOLN: 500.0000 mL | Freq: Once | INTRAVENOUS | Status: DC

## 2021-03-10 NOTE — Op Note (Signed)
[place] Patient Name: Thomas Perez Procedure Date: 03/10/2021 1:03 PM MRN: [id number] Endoscopist: Docia Chuck. Henrene Pastor , MD Age: 50 Referring MD:  Date of Birth: [birth year] Gender: Male Account #: [id number] Procedure:                Colonoscopy with ciold snare polypectomy x 1, with                            biopsies Indications:              Screening for colorectal malignant neoplasm,                            Incidental diarrhea noted (4 months, new, normal                            labs a nd stool studies) Medicines:                Monitored Anesthesia Care Procedure:                Pre-Anesthesia Assessment:                           - Prior to the procedure, a History and Physical                            was performed, and patient medications and                            allergies were reviewed. The patient's tolerance of                            previous anesthesia was also reviewed. The risks                            and benefits of the procedure and the sedation                            options and risks were discussed with the patient.                            All questions were answered, and informed consent                            was obtained. Prior Anticoagulants: The patient has                            taken no previous anticoagulant or antiplatelet                            agents. ASA Grade Assessment: II - A patient with                            mild systemic disease. After reviewing the risks  and benefits, the patient was deemed in                            satisfactory condition to undergo the procedure.                           After obtaining informed consent, the colonoscope                            was passed under direct vision. Throughout the                            procedure, the patient's blood pressure, pulse, and                            oxygen saturations were monitored continuously.  The                            Olympus CF-HQ190L [id number]) Colonoscope was                            introduced through the anus and advanced to the the                            cecum, identified by appendiceal orifice and                            ileocecal valve. The ileocecal valve, appendiceal                            orifice, and rectum were photographed. The quality                            of the bowel preparation was excellent. The                            colonoscopy was performed without difficulty. The                            patient tolerated the procedure well. The bowel                            preparation used was SUPREP via split dose                            instruction. Scope In: 1:38:11 PM Scope Out: 1:54:37 PM Scope Withdrawal Time: 0 hours 14 minutes 40 seconds  Total Procedure Duration: 0 hours 16 minutes 26 seconds  Findings:                 A 3 mm polyp was found in the sigmoid colon. The                            polyp was sessile. The polyp was removed with a  cold snare. Resection and retrieval were complete.                           A few diverticula were found in the sigmoid colon.                           There was an ersion on the ICV (bx). The entire                            examined colon appeared otherwise normal on direct                            and retroflexion views. Biopsies for histology were                            taken with a cold forceps from the entire colon for                            evaluation of microscopic colitis. Complications:            No immediate complications. Estimated blood loss:                            None. Estimated Blood Loss:     Estimated blood loss: none. Impression:               - One 3 mm polyp in the sigmoid colon, removed with                            a cold snare. Resected and retrieved.                           - ICV Erosion (bx)                            - Diverticulosis in the sigmoid colon.                           - The entire examined colon is otherwise normal on                            direct and retroflexion views. Recommendation:           - Repeat colonoscopy in 7-10 years for surveillance.                           - Patient has a contact number available for                            emergencies. The signs and symptoms of potential                            delayed complications were discussed with the  patient. Return to normal activities tomorrow.                            Written discharge instructions were provided to the                            patient.                           - Resume previous diet.                           - Continue present medications.                           - Await pathology results.                           - OK to use imodium                           - Consider fiber, colestid, metronidazole if                            iopsies negative                           - Office follow up to be determined Docia Chuck. Henrene Pastor, MD 03/10/2021 2:08:53 PM This report has been signed electronically.

## 2021-03-10 NOTE — Patient Instructions (Addendum)
Handouts given for polyps and diverticulosis.  YOU HAD AN ENDOSCOPIC PROCEDURE TODAY AT THE [facility]:   Refer to the procedure report that was given to you for any specific questions about what was found during the examination.  If the procedure report does not answer your questions, please call your gastroenterologist to clarify.  If you requested that your care partner not be given the details of your procedure findings, then the procedure report has been included in a sealed envelope for you to review at your convenience later.  YOU SHOULD EXPECT: Some feelings of bloating in the abdomen. Passage of more gas than usual.  Walking can help get rid of the air that was put into your GI tract during the procedure and reduce the bloating. If you had a lower endoscopy (such as a colonoscopy or flexible sigmoidoscopy) you may notice spotting of blood in your stool or on the toilet paper. If you underwent a bowel prep for your procedure, you may not have a normal bowel movement for a few days.  Please Note:  You might notice some irritation and congestion in your nose or some drainage.  This is from the oxygen used during your procedure.  There is no need for concern and it should clear up in a day or so.  SYMPTOMS TO REPORT IMMEDIATELY:  Following lower endoscopy (colonoscopy or flexible sigmoidoscopy):  Excessive amounts of blood in the stool  Significant tenderness or worsening of abdominal pains  Swelling of the abdomen that is new, acute  Fever of 100F or higher  For urgent or emergent issues, a gastroenterologist can be reached at any hour by calling [phone]. Do not use MyChart messaging for urgent concerns.    DIET:  We do recommend a small meal at first, but then you may proceed to your regular diet.  Drink plenty of fluids but you should avoid alcoholic beverages for 24 hours.  ACTIVITY:  You should plan to take it easy for the rest of today and you should NOT DRIVE  or use heavy machinery until tomorrow (because of the sedation medicines used during the test).    FOLLOW UP: Our staff will call the number listed on your records 48-72 hours following your procedure to check on you and address any questions or concerns that you may have regarding the information given to you following your procedure. If we do not reach you, we will leave a message.  We will attempt to reach you two times.  During this call, we will ask if you have developed any symptoms of COVID 19. If you develop any symptoms (ie: fever, flu-like symptoms, shortness of breath, cough etc.) before then, please call [phone].  If you test positive for Covid 19 in the 2 weeks post procedure, please call and report this information to us.    If any biopsies were taken you will be contacted by phone or by letter within the next 1-3 weeks.  Please call us at [phone] if you have not heard about the biopsies in 3 weeks.    SIGNATURES/CONFIDENTIALITY: You and/or your care partner have signed paperwork which will be entered into your electronic medical record.  These signatures attest to the fact that that the information above on your After Visit Summary has been reviewed and is understood.  Full responsibility of the confidentiality of this discharge information lies with you and/or your care-partner.  

## 2021-03-10 NOTE — Progress Notes (Signed)
Called to room to assist during endoscopic procedure.  Patient ID and intended procedure confirmed with present staff. Received instructions for my participation in the procedure from the performing physician.  

## 2021-03-10 NOTE — Progress Notes (Signed)
PT taken to PACU. Monitors in place. VSS. Report given to RN. 

## 2021-03-14 ENCOUNTER — Telehealth: Payer: Self-pay

## 2021-03-14 NOTE — Telephone Encounter (Signed)
  Follow up Call-  Call back number 03/10/2021  Post procedure Call Back phone  # [phone]  Permission to leave phone message Yes  Some recent data might be hidden     Patient questions:  Do you have a fever, pain , or abdominal swelling? No. Pain Score  0 *  Have you tolerated food without any problems? Yes.    Have you been able to return to your normal activities? Yes.    Do you have any questions about your discharge instructions: Diet   No. Medications  No. Follow up visit  No.  Do you have questions or concerns about your Care? No.  Actions: * If pain score is 4 or above: No action needed, pain <4.

## 2021-03-15 ENCOUNTER — Other Ambulatory Visit: Payer: Self-pay | Admitting: Physician Assistant

## 2021-03-21 ENCOUNTER — Telehealth: Payer: Self-pay

## 2021-03-21 ENCOUNTER — Encounter: Payer: Self-pay | Admitting: Internal Medicine

## 2021-03-21 NOTE — Telephone Encounter (Signed)
-----   Message from Irene Shipper, MD sent at 03/21/2021 10:47 AM EDT ----- Please let the patient know that his random colon biopsies were unremarkable.  Nothing to explain diarrhea.  Please start Metamucil 2 tablespoons daily for his diarrhea.  Arrange office follow-up in about 6 weeks.  I have also sent him a letter.  Thanks

## 2021-03-21 NOTE — Telephone Encounter (Signed)
Left message for patient to please call back. 

## 2021-03-29 ENCOUNTER — Other Ambulatory Visit: Payer: Self-pay | Admitting: Physician Assistant

## 2021-04-03 ENCOUNTER — Other Ambulatory Visit: Payer: Self-pay

## 2021-04-03 MED ORDER — METRONIDAZOLE 250 MG PO TABS
250.0000 mg | ORAL_TABLET | Freq: Four times a day (QID) | ORAL | 0 refills | Status: DC
Start: 2021-04-03 — End: 2021-05-01

## 2021-04-13 DIAGNOSIS — Z23 Encounter for immunization: Secondary | ICD-10-CM | POA: Diagnosis not present

## 2021-05-01 ENCOUNTER — Encounter: Payer: Self-pay | Admitting: Internal Medicine

## 2021-05-01 ENCOUNTER — Ambulatory Visit (INDEPENDENT_AMBULATORY_CARE_PROVIDER_SITE_OTHER): Payer: BC Managed Care – PPO | Admitting: Internal Medicine

## 2021-05-01 VITALS — BP 130/84 | HR 64 | Ht 70.0 in | Wt 230.5 lb

## 2021-05-01 DIAGNOSIS — R197 Diarrhea, unspecified: Secondary | ICD-10-CM

## 2021-05-01 DIAGNOSIS — D125 Benign neoplasm of sigmoid colon: Secondary | ICD-10-CM | POA: Diagnosis not present

## 2021-05-01 MED ORDER — COLESTIPOL HCL 1 G PO TABS: 2.0000 g | ORAL_TABLET | Freq: Two times a day (BID) | ORAL | 2 refills | Status: DC

## 2021-05-01 NOTE — Patient Instructions (Signed)
If you are age 52 or older, your body mass index should be between 23-30. Your Body mass index is 33.07 kg/m. If this is out of the aforementioned range listed, please consider follow up with your Primary Care Provider.  If you are age 8 or younger, your body mass index should be between 19-25. Your Body mass index is 33.07 kg/m. If this is out of the aformentioned range listed, please consider follow up with your Primary Care Provider.   __________________________________________________________  The [place] [facility] providers would like to encourage you to use [facility] to communicate with providers for non-urgent requests or questions.  Due to long hold times on the telephone, sending your provider a message by [facility] may be a faster and more efficient way to get a response.  Please allow 48 business hours for a response.  Please remember that this is for non-urgent requests.   We have sent the following medications to your pharmacy for you to pick up at your convenience:  Colestid  Please follow up in 3 months

## 2021-05-01 NOTE — Progress Notes (Signed)
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  He was evaluated in this office by the GI physician assistant February 17, 2021 regarding a 48-monthhistory of chronic loose stools, namely in the morning.  Blood work, celiac testing, and stool studies for pathogens all returned negative or normal.  Normal C-reactive protein.  He underwent complete colonoscopy March 10, 2021.  He had a diminutive adenoma which was removed.  Nonspecific ileocecal valve erosion with nonspecific biopsy.  Random colon biopsies were normal.  No evidence for microscopic colitis.  The ileum was normal.  He was placed on Metamucil 2 tablespoons daily.  He continues on this.  There has been no improvement in his bowel habits with Metamucil.  He was then prescribed a 10-day course of Flagyl.  This did not help.  He did take Imodium which resulted in severe unwanted constipation.  Patient continues to report 4-6 loose stools in the morning after awakening up until lunchtime.  In the morning, approximately half an hour after eating, he will experience nausea some diaphoresis.  This typically lasts up to an hour.  Remaining portions of the day he does fine.  No other issues with postprandial symptoms.  No nocturnal component.  He does take Pepto-Bismol some mornings, which helps.  He is status postcholecystectomy many years ago.  He has had no weight loss.  No significant abdominal pain.  REVIEW OF SYSTEMS:  All non-GI ROS negative unless otherwise stated in the HPI except for sinus allergy, fevers, headaches, sleeping problems  Past Medical History:  Diagnosis Date   Arthritis    SVT (supraventricular tachycardia) ([place]     History reviewed. No pertinent surgical history.  Social History FAmrit Nevells reports that he has never smoked. He has never used smokeless tobacco. He reports that he does not drink alcohol and  does not use drugs.  family history includes Arrhythmia in his father and mother; Esophageal cancer in his maternal grandfather; Hypertension in his father; Prostate cancer in his paternal grandfather.  Allergies  Allergen Reactions   Hydrocodone-Acetaminophen Nausea And Vomiting and Other (See Comments)    He has these symptoms when he doesn't eat before taking.       PHYSICAL EXAMINATION: Vital signs: BP 130/84 (BP Location: Left Arm, Patient Position: Sitting, Cuff Size: Normal)   Pulse 64   Ht '5\' 10"'$  (1.778 m) Comment: height measured without shoes  Wt 230 lb 8 oz (104.6 kg)   BMI 33.07 kg/m   Constitutional: generally well-appearing, no acute distress Psychiatric: alert and oriented x3, cooperative Eyes: extraocular movements intact, anicteric, conjunctiva pink Mouth: oral pharynx moist, no lesions Neck: supple no lymphadenopathy Cardiovascular: heart regular rate and rhythm, no murmur Lungs: clear to auscultation bilaterally Abdomen: soft, nontender, nondistended, no obvious ascites, no peritoneal signs, normal bowel sounds, no organomegaly Rectal: Omitted.  Recent rectal examination at colonoscopy normal Extremities: no lower extremity edema bilaterally Skin: no lesions on visible extremities Neuro: No focal deficits.  Cranial nerves intact  ASSESSMENT:  1.  677-monthistory of chronic loose stools.  Negative extensive work-up to date.  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  Rule out bile salt related diarrhea 2.  Colonoscopy with adenomatous polyp, June 2022  PLAN:  1.  Prescribe Colestid 2 g twice daily.  Adjust as needed for constipation.  Medication effects and risks reviewed. 2.  Surveillance colonoscopy around June 2029 3.  Office follow-up 3 months.  Contact the office in the interim for any questions or problems

## 2021-06-28 ENCOUNTER — Other Ambulatory Visit: Payer: Self-pay

## 2021-06-28 MED ORDER — METRONIDAZOLE 250 MG PO TABS
250.0000 mg | ORAL_TABLET | Freq: Three times a day (TID) | ORAL | 0 refills | Status: DC
Start: 2021-06-28 — End: 2021-08-23

## 2021-07-07 NOTE — Telephone Encounter (Signed)
Can trial course of IBgard along with probiotics x4 weeks.  Recommend low FODMAP diet.

## 2021-08-23 ENCOUNTER — Encounter: Payer: Self-pay | Admitting: Internal Medicine

## 2021-08-23 ENCOUNTER — Ambulatory Visit (INDEPENDENT_AMBULATORY_CARE_PROVIDER_SITE_OTHER): Payer: BC Managed Care – PPO | Admitting: Internal Medicine

## 2021-08-23 VITALS — BP 134/84 | HR 68 | Ht 70.0 in | Wt 240.0 lb

## 2021-08-23 DIAGNOSIS — D125 Benign neoplasm of sigmoid colon: Secondary | ICD-10-CM

## 2021-08-23 DIAGNOSIS — R197 Diarrhea, unspecified: Secondary | ICD-10-CM

## 2021-08-23 DIAGNOSIS — R109 Unspecified abdominal pain: Secondary | ICD-10-CM

## 2021-08-23 DIAGNOSIS — K589 Irritable bowel syndrome without diarrhea: Secondary | ICD-10-CM

## 2021-08-23 MED ORDER — RIFAXIMIN 550 MG PO TABS: 550.0000 mg | ORAL_TABLET | Freq: Three times a day (TID) | ORAL | 1 refills | Status: AC

## 2021-08-23 NOTE — Progress Notes (Signed)
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  See that dictation.  Stool studies were negative as were screening test for celiac disease.  CBC, sedimentation rate, CRP, and GI pathogen panel were unremarkable.  He was prescribed Bentyl.  He subsequently underwent complete colonoscopy with biopsies March 10, 2021.  He had a nonspecific erosion at the ileocecal valve.  The terminal ileum was normal.  Random colon biopsies were negative for microscopic colitis or other abnormality.  Diminutive colon polyp was removed and found to be a tubular adenoma for which follow-up in 7 years recommended.  I saw the patient for follow-up May 02, 2021.  No improvement with Metamucil.  No improvement with 10-day course of metronidazole.  Imodium did result in constipation.  He continues to report 4-6 loose bowel movements per morning he was taking Pepto-Bismol which seemed to help.  We prescribed Colestid 2 g twice daily.  Office follow-up at this time.  He tells me that he felt like Colestid was helpful for the first few weeks.  Not so much thereafter.  He has been off the medication for 3 weeks.  He is also tried probiotic and IBgard.  Again, seem to be effective initially.  The effect was not durable.  No new complaints except for increased frequency of heartburn.  He takes famotidine as needed.  He has had weight gain.  REVIEW OF SYSTEMS:  All non-GI ROS negative.  Past Medical History:  Diagnosis Date   Arthritis    SVT (supraventricular tachycardia) ([place])     History reviewed. No pertinent surgical history.  Social History Lamorris Knoblock  reports that he has never smoked. He has never used smokeless tobacco. He reports that he does not drink alcohol and does not use drugs.  family history includes Arrhythmia in his father  and mother; Esophageal cancer in his maternal grandfather; Hypertension in his father; Prostate cancer in his paternal grandfather.  Allergies  Allergen Reactions   Hydrocodone-Acetaminophen Nausea And Vomiting and Other (See Comments)    He has these symptoms when he doesn't eat before taking.       PHYSICAL EXAMINATION: Vital signs: BP 134/84   Pulse 68   Ht 5\' 10"  (1.778 m)   Wt 240 lb (108.9 kg)   SpO2 98%   BMI 34.44 kg/m   Constitutional: generally well-appearing, no acute distress Psychiatric: alert and oriented x3, cooperative Eyes: extraocular movements intact, anicteric, conjunctiva pink Mouth: Mask Neck: supple no lymphadenopathy Cardiovascular: heart regular rate and rhythm, no murmur Lungs: clear to auscultation bilaterally Abdomen: soft, nontender, nondistended, no obvious ascites, no peritoneal signs, normal bowel sounds, no organomegaly Rectal: Omitted Extremities: no clubbing, cyanosis, or lower extremity edema bilaterally Skin: no lesions on visible extremities Neuro: No focal deficits.  Cranial nerves intact  ASSESSMENT:  1.  25-month history of change in bowel habits as manifested by multiple loose/urgent stools in the morning with varying degrees of abdominal cramping.  Negative extensive work-up.  No alarm features.  Has not responded to multiple therapies such as Metamucil, metronidazole, Colestid, IBgard, probiotic.  Imodium results and constipation.  Picture most compatible with postinfectious IBS 2.  Complete colonoscopy June 2022 as described.  Diminutive adenoma   PLAN:  1.  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 2.  Prescribe Xifaxan 550 mg 3 times daily  x2 weeks.  1 refill 3.  Tincture of time.  I told him to continue with expectant management, as this may improve spontaneously.  Certainly, he should reach out should his condition worsen in any way.  Otherwise, routine follow-up in 6 months 4.  Surveillance colonoscopy around  June 2029

## 2021-08-23 NOTE — Patient Instructions (Addendum)
If you are age 52 or younger, your body mass index should be between 19-25. Your Body mass index is 34.44 kg/m. If this is out of the aformentioned range listed, please consider follow up with your Primary Care Provider.   ________________________________________________________  The [place] [facility] providers would like to encourage you to use [facility] to communicate with providers for non-urgent requests or questions.  Due to long hold times on the telephone, sending your provider a message by [facility] may be a faster and more efficient way to get a response.  Please allow 48 business hours for a response.  Please remember that this is for non-urgent requests.  _______________________________________________________  We have sent the following medications to your pharmacy for you to pick up at your convenience:  START: Xifaxin 550mg  one tablet three times daily for 3 weeks.  Thank you for entrusting me with your care and choosing [facility].  Dr Henrene Pastor

## 2021-09-21 ENCOUNTER — Telehealth: Payer: Self-pay

## 2021-09-21 NOTE — Telephone Encounter (Signed)
I called to [facility] to check the status of Xifaxan 550mg  that was sent to pharmacy on 08-23-21.  I spoke with Marylyn Ishihara at [facility] who advised there was no prior auth required, but patient has a $943.80 copay.  Marylyn Ishihara also advised that [facility] location) has been trying to reach patient by making multiple calls, but they have been unable to reach the patient.   I called patient on his cell phone, and left a detailed voicemail regarding Xifaxan status. Advised patient to contact [facility] in [place] at [phone] or [phone]. Advised patient to return call to our office to inform our office of the outcome.

## 2021-09-22 NOTE — Telephone Encounter (Signed)
An alternative is metronidazole. However, he has tried this and it did not help. Not certain that Xifaxan would help, so reasonable not to try given the cost. He might try Pepto-bismol (let him know that this may turn stools dark, but don't worry if it does. Thanks  Dr. Henrene Pastor

## 2021-09-22 NOTE — Telephone Encounter (Signed)
Patient returned my call stating that he had been in contact with [facility], and that [place] was going to run him over $1000.00 for a 14 day supply.  Patient states he can not afford the medication and would like to know if there are any other options.

## 2021-09-22 NOTE — Telephone Encounter (Signed)
Left message for patient to return call to further discuss options.  Will continue efforts.

## 2021-09-25 NOTE — Telephone Encounter (Signed)
Left message for patient to return call to further discuss options.  Will continue efforts.

## 2021-09-26 NOTE — Telephone Encounter (Signed)
Patient returned call and was advise of Dr Blanch Media recommendations: 1-An alternative is metronidazole. However, he has tried this and it did not help. 2-Not certain that Xifaxan would help, so reasonable not to try given the cost. 3-He might try Pepto-bismol (let him know that this may turn stools dark, but don't worry if it does. Thanks  Dr. Henrene Pastor   Patient will try Pepto-bismol for several weeks, and call back to our office to report how he is feeling.  If he continues to have diarrhea, he will make a follow up appointment to further discuss with Dr Henrene Pastor.  Patient agreed to plan and verbalized understanding.  No further questions.
